# Patient Record
Sex: Female | Born: 1945 | Race: White | Hispanic: No | Marital: Married | Smoking: Former smoker
Health system: Southern US, Community
[De-identification: ages and names within clinical notes are randomized; demographics above are authoritative.]

## PROBLEM LIST (undated history)

## (undated) DIAGNOSIS — E039 Hypothyroidism, unspecified: Secondary | ICD-10-CM

## (undated) DIAGNOSIS — F32A Depression, unspecified: Secondary | ICD-10-CM

## (undated) DIAGNOSIS — F329 Major depressive disorder, single episode, unspecified: Secondary | ICD-10-CM

## (undated) DIAGNOSIS — I10 Essential (primary) hypertension: Secondary | ICD-10-CM

## (undated) DIAGNOSIS — K219 Gastro-esophageal reflux disease without esophagitis: Secondary | ICD-10-CM

## (undated) DIAGNOSIS — M1990 Unspecified osteoarthritis, unspecified site: Secondary | ICD-10-CM

## (undated) DIAGNOSIS — R0602 Shortness of breath: Secondary | ICD-10-CM

## (undated) DIAGNOSIS — Z87442 Personal history of urinary calculi: Secondary | ICD-10-CM

## (undated) DIAGNOSIS — E669 Obesity, unspecified: Secondary | ICD-10-CM

## (undated) HISTORY — PX: TUBAL LIGATION: SHX77

## (undated) HISTORY — DX: Gastro-esophageal reflux disease without esophagitis: K21.9

## (undated) HISTORY — DX: Unspecified osteoarthritis, unspecified site: M19.90

## (undated) HISTORY — DX: Essential (primary) hypertension: I10

## (undated) HISTORY — DX: Personal history of urinary calculi: Z87.442

## (undated) HISTORY — DX: Hypothyroidism, unspecified: E03.9

## (undated) HISTORY — PX: KNEE SURGERY: SHX244

## (undated) HISTORY — DX: Shortness of breath: R06.02

## (undated) HISTORY — PX: FOOT SURGERY: SHX648

## (undated) HISTORY — DX: Depression, unspecified: F32.A

## (undated) HISTORY — DX: Obesity, unspecified: E66.9

## (undated) HISTORY — PX: TONSILLECTOMY: SUR1361

## (undated) HISTORY — DX: Major depressive disorder, single episode, unspecified: F32.9

---

## 1998-05-25 ENCOUNTER — Other Ambulatory Visit: Admission: RE | Admit: 1998-05-25 | Discharge: 1998-05-25 | Payer: Self-pay | Admitting: *Deleted

## 1998-07-28 ENCOUNTER — Encounter: Payer: Self-pay | Admitting: *Deleted

## 1998-07-28 ENCOUNTER — Ambulatory Visit (HOSPITAL_COMMUNITY): Admission: RE | Admit: 1998-07-28 | Discharge: 1998-07-28 | Payer: Self-pay | Admitting: *Deleted

## 1999-10-21 ENCOUNTER — Other Ambulatory Visit: Admission: RE | Admit: 1999-10-21 | Discharge: 1999-10-21 | Payer: Self-pay | Admitting: Internal Medicine

## 2000-11-10 ENCOUNTER — Encounter: Payer: Self-pay | Admitting: Internal Medicine

## 2000-11-10 ENCOUNTER — Ambulatory Visit (HOSPITAL_COMMUNITY): Admission: RE | Admit: 2000-11-10 | Discharge: 2000-11-10 | Payer: Self-pay | Admitting: Internal Medicine

## 2002-05-23 ENCOUNTER — Encounter: Payer: Self-pay | Admitting: Internal Medicine

## 2002-05-23 ENCOUNTER — Ambulatory Visit (HOSPITAL_COMMUNITY): Admission: RE | Admit: 2002-05-23 | Discharge: 2002-05-23 | Payer: Self-pay | Admitting: Internal Medicine

## 2007-01-25 ENCOUNTER — Ambulatory Visit (HOSPITAL_COMMUNITY): Admission: RE | Admit: 2007-01-25 | Discharge: 2007-01-25 | Payer: Self-pay | Admitting: Internal Medicine

## 2008-01-19 ENCOUNTER — Ambulatory Visit (HOSPITAL_COMMUNITY): Admission: RE | Admit: 2008-01-19 | Discharge: 2008-01-19 | Payer: Self-pay | Admitting: Internal Medicine

## 2008-02-08 ENCOUNTER — Ambulatory Visit (HOSPITAL_COMMUNITY): Admission: RE | Admit: 2008-02-08 | Discharge: 2008-02-08 | Payer: Self-pay | Admitting: Internal Medicine

## 2009-02-14 ENCOUNTER — Ambulatory Visit (HOSPITAL_COMMUNITY): Admission: RE | Admit: 2009-02-14 | Discharge: 2009-02-14 | Payer: Self-pay | Admitting: Internal Medicine

## 2010-04-24 ENCOUNTER — Encounter
Admission: RE | Admit: 2010-04-24 | Discharge: 2010-04-24 | Payer: Self-pay | Source: Home / Self Care | Attending: Internal Medicine | Admitting: Internal Medicine

## 2010-04-28 ENCOUNTER — Encounter
Admission: RE | Admit: 2010-04-28 | Discharge: 2010-04-28 | Payer: Self-pay | Source: Home / Self Care | Attending: Internal Medicine | Admitting: Internal Medicine

## 2010-09-28 ENCOUNTER — Emergency Department (HOSPITAL_COMMUNITY)
Admission: EM | Admit: 2010-09-28 | Discharge: 2010-09-28 | Disposition: A | Discharge status: Home / Self Care | Payer: Medicare Other | Attending: Emergency Medicine | Admitting: Emergency Medicine

## 2010-09-28 ENCOUNTER — Emergency Department (HOSPITAL_COMMUNITY): Payer: Medicare Other

## 2010-09-28 DIAGNOSIS — R1032 Left lower quadrant pain: Secondary | ICD-10-CM | POA: Insufficient documentation

## 2010-09-28 DIAGNOSIS — I10 Essential (primary) hypertension: Secondary | ICD-10-CM | POA: Insufficient documentation

## 2010-09-28 DIAGNOSIS — E039 Hypothyroidism, unspecified: Secondary | ICD-10-CM | POA: Insufficient documentation

## 2010-09-28 DIAGNOSIS — N201 Calculus of ureter: Secondary | ICD-10-CM | POA: Insufficient documentation

## 2010-09-28 LAB — DIFFERENTIAL
Basophils Relative: 0 % (ref 0–1)
Eosinophils Absolute: 0 10*3/uL (ref 0.0–0.7)
Lymphocytes Relative: 14 % (ref 12–46)
Monocytes Relative: 9 % (ref 3–12)
Neutrophils Relative %: 77 % (ref 43–77)

## 2010-09-28 LAB — COMPREHENSIVE METABOLIC PANEL
BUN: 23 mg/dL (ref 6–23)
Calcium: 10 mg/dL (ref 8.4–10.5)
GFR calc Af Amer: 57 mL/min — ABNORMAL LOW (ref >60)
Glucose, Bld: 111 mg/dL — ABNORMAL HIGH (ref 70–99)
Total Protein: 7.8 g/dL (ref 6.0–8.3)

## 2010-09-28 LAB — URINALYSIS, ROUTINE W REFLEX MICROSCOPIC
Bilirubin Urine: NEGATIVE
Nitrite: NEGATIVE
Specific Gravity, Urine: 1.017 (ref 1.005–1.030)
pH: 7.5 (ref 5.0–8.0)

## 2010-09-28 LAB — URINE MICROSCOPIC-ADD ON

## 2010-09-28 LAB — CBC
Hemoglobin: 11.8 g/dL — ABNORMAL LOW (ref 12.0–15.0)
MCV: 76.8 fL — ABNORMAL LOW (ref 78.0–100.0)
Platelets: 204 10*3/uL (ref 150–400)
RBC: 4.57 MIL/uL (ref 3.87–5.11)
WBC: 8 10*3/uL (ref 4.0–10.5)

## 2010-09-28 MED ORDER — IOHEXOL 300 MG/ML  SOLN
100.0000 mL | Freq: Once | INTRAMUSCULAR | Status: AC | PRN
  Administered 2010-09-28: 100 mL via INTRAVENOUS

## 2010-09-29 LAB — URINE CULTURE
Colony Count: 10000
Culture  Setup Time: 201206231806

## 2010-10-07 ENCOUNTER — Ambulatory Visit (HOSPITAL_COMMUNITY)
Admission: RE | Admit: 2010-10-07 | Discharge: 2010-10-07 | Disposition: A | Discharge status: Home / Self Care | Payer: Medicare Other | Source: Ambulatory Visit | Attending: Urology | Admitting: Urology

## 2010-10-07 DIAGNOSIS — E039 Hypothyroidism, unspecified: Secondary | ICD-10-CM | POA: Insufficient documentation

## 2010-10-07 DIAGNOSIS — I10 Essential (primary) hypertension: Secondary | ICD-10-CM | POA: Insufficient documentation

## 2010-10-07 DIAGNOSIS — K219 Gastro-esophageal reflux disease without esophagitis: Secondary | ICD-10-CM | POA: Insufficient documentation

## 2010-10-07 DIAGNOSIS — M129 Arthropathy, unspecified: Secondary | ICD-10-CM | POA: Insufficient documentation

## 2010-10-07 DIAGNOSIS — N201 Calculus of ureter: Secondary | ICD-10-CM | POA: Insufficient documentation

## 2013-06-12 IMAGING — CT CT ABD-PELV W/ CM
2 of 5 series · 17 of 46 positions shown, 19 images · IV contrast (APPLIED)
Comparison: MRI from 04/28/2010.  CT from 01/19/2008.

CLINICAL DATA: Left lower quadrant pain with nausea vomiting.

CT ABDOMEN AND PELVIS WITH CONTRAST
TECHNIQUE: Multidetector CT imaging of the abdomen and pelvis was
performed following the standard protocol during bolus
administration of intravenous contrast.
Contrast: 100 ml Emnipaque-8PP

[Series 2: abd_pel 5.0 b40f st · axial · 0.81mm/px · z∈[+897,+1287]mm · 14 of 88 slices shown, 16 images]
[im 5/88  soft-tissue]
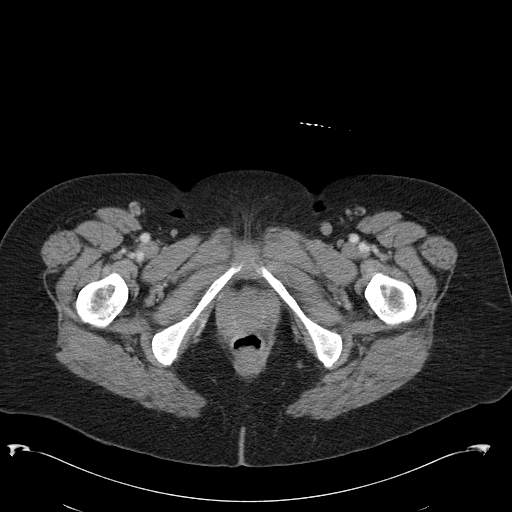
[im 5/88  bone]
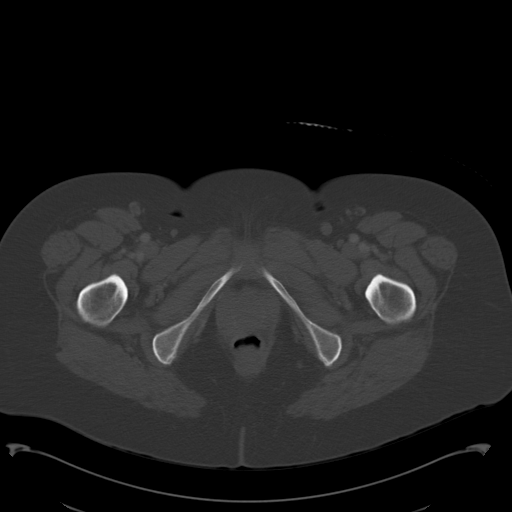
[im 10/88  soft-tissue]
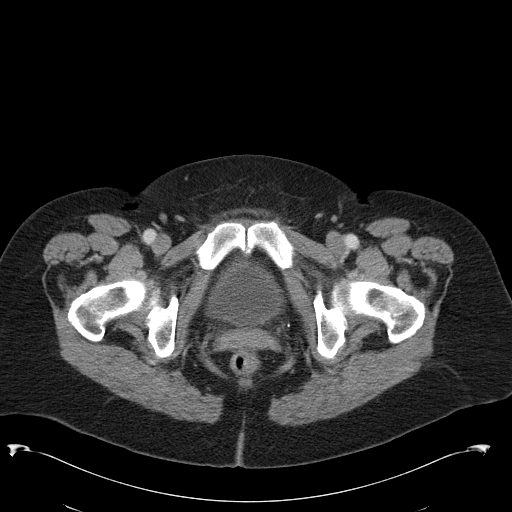
[im 20/88  soft-tissue]
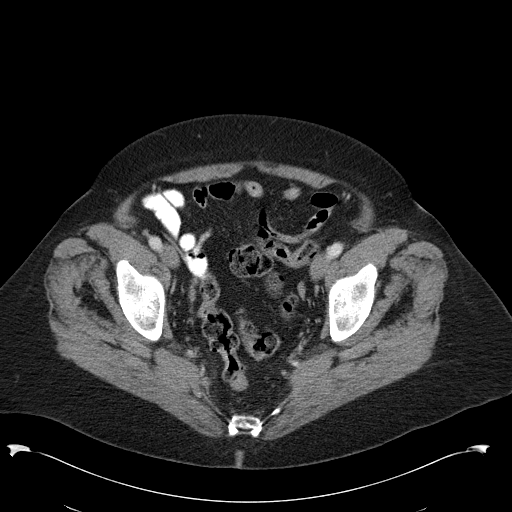
[im 25/88  soft-tissue]
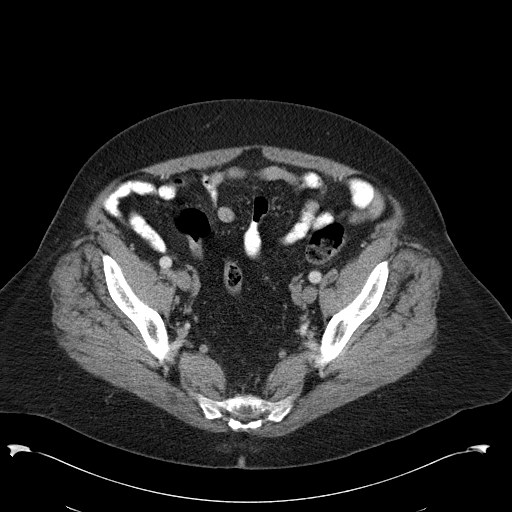
[im 30/88  soft-tissue]
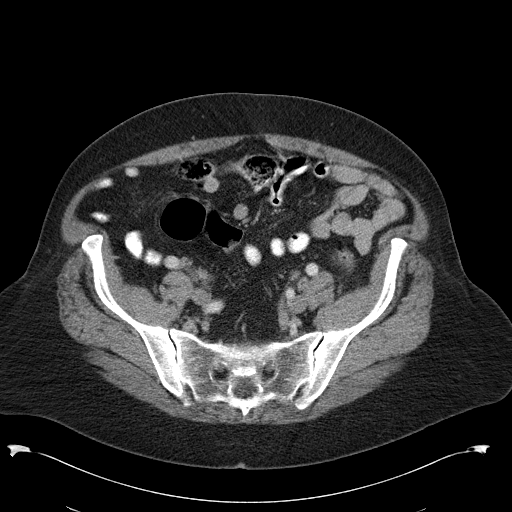
[im 34/88  soft-tissue]
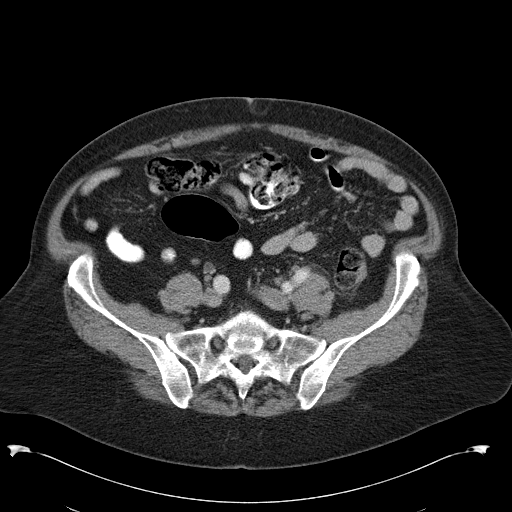
[im 39/88  soft-tissue]
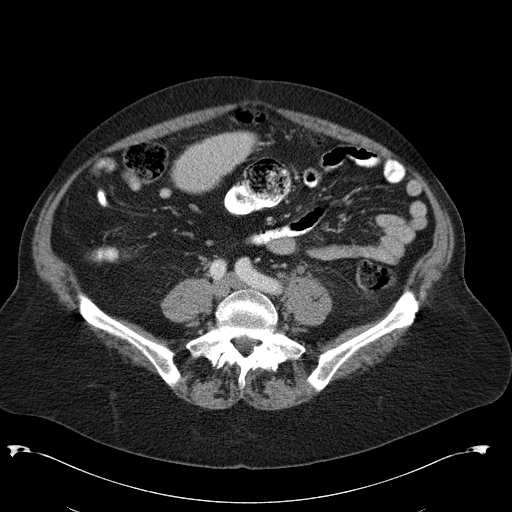
[im 49/88  soft-tissue]
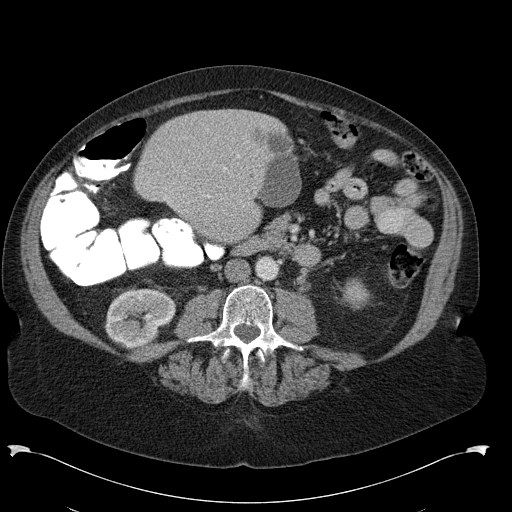
[im 54/88  soft-tissue]
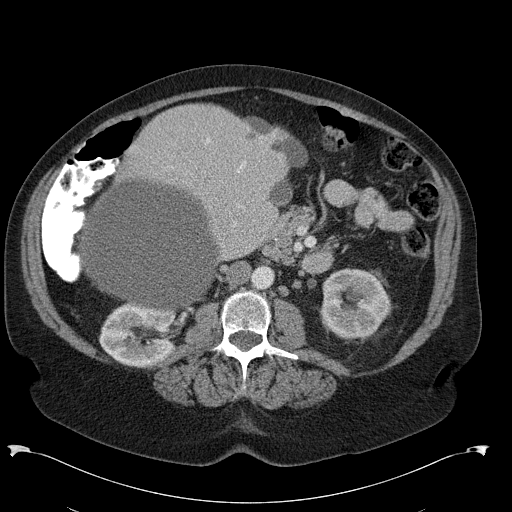
[im 54/88  bone]
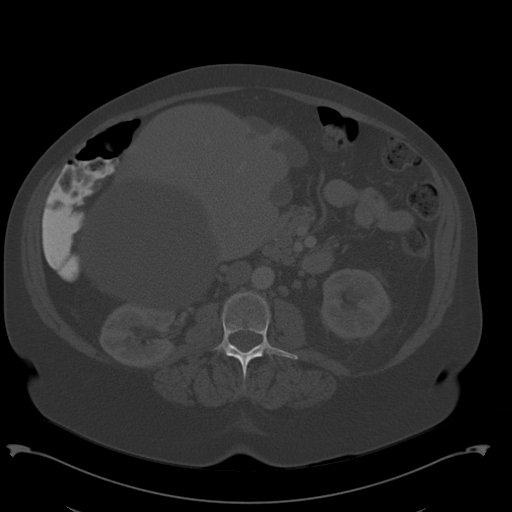
[im 59/88  soft-tissue]
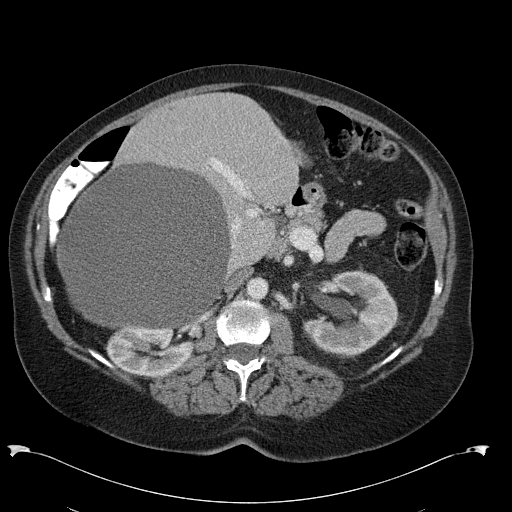
[im 63/88  soft-tissue]
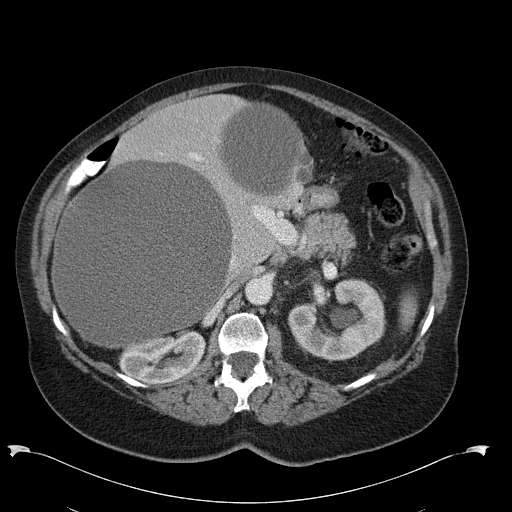
[im 68/88  soft-tissue]
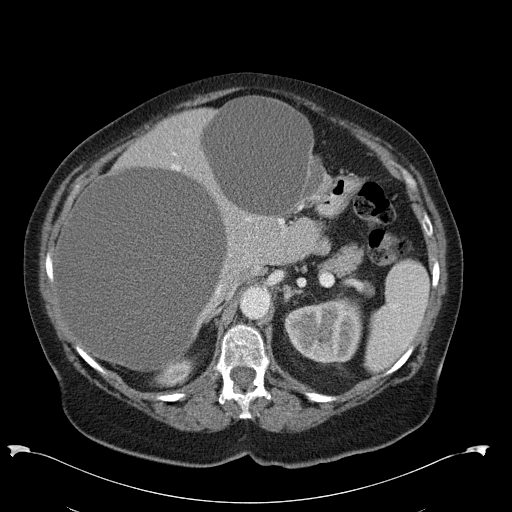
[im 78/88  soft-tissue]
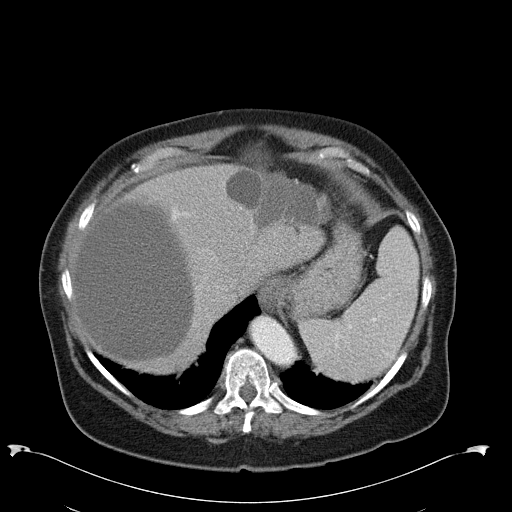
[im 83/88  soft-tissue]
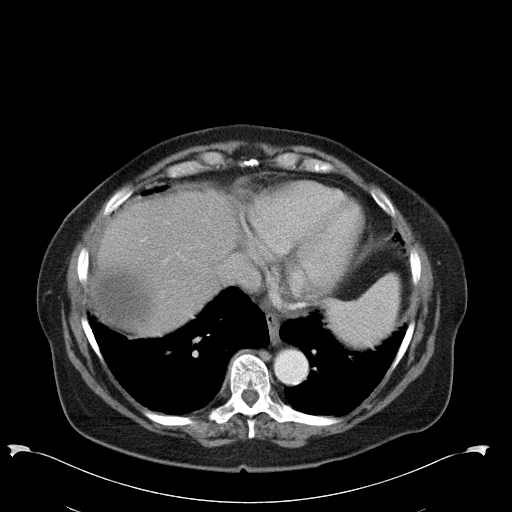

[Series 602: coronal abdomen · coronal · 0.89mm/px · 3 of 159 slices shown]
[im 53/159  soft-tissue]
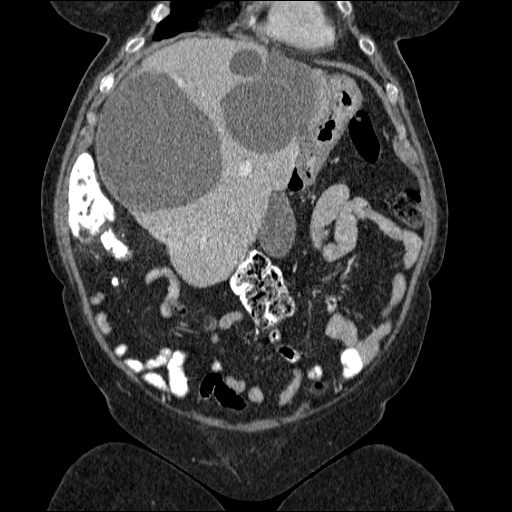
[im 71/159  soft-tissue]
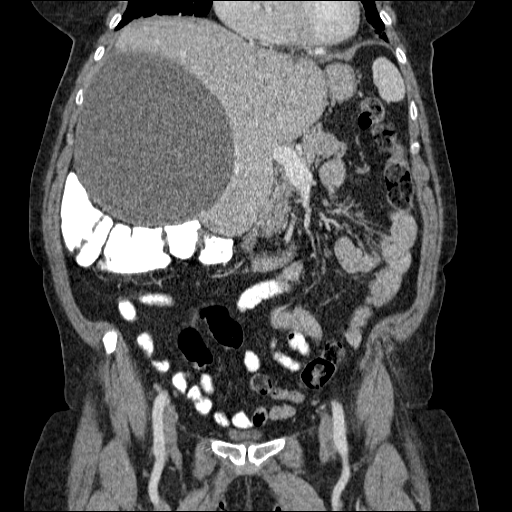
[im 88/159  soft-tissue]
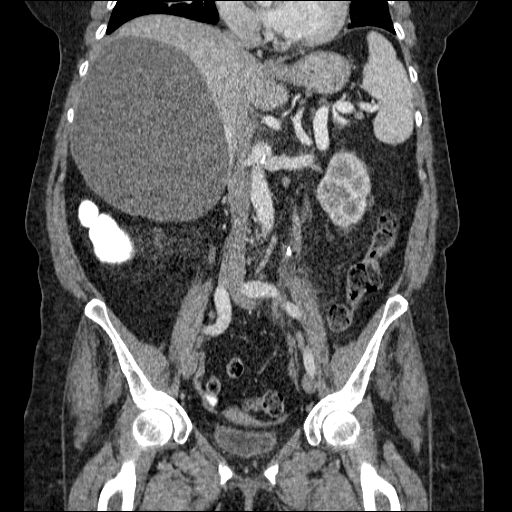

[17 of 46 positions shown; findings below may reference images not displayed]

FINDINGS: Large hepatic cysts are again noted.  The largest is in
the right liver measuring about 16 cm.  Other prominent cysts are
seen in the left liver, the largest of which measures about 10 cm.
This is stable since the prior MRI.  The spleen is normal.  The
stomach, duodenum, pancreas, and adrenal glands are normal.
Gallbladder seems to be in an atypical location under the left
liver.  The pelvis secondary to anatomic distortion from the large
cyst.

There is a subtle asymmetric renal perfusion with decreased
perfusion noted to the left kidney.  Tiny bilateral renal cortical
cysts are evident.  There is some left perinephric
edema/inflammation.  6 x 4 x 9 mm stone is identified in the
proximal left ureter.

No abdominal aortic aneurysm.  No free fluid in the abdomen.  No
abdominal lymphadenopathy.

Imaging through the pelvis shows no stones in the distal ureters or
bladder.  Uterus is unremarkable aside from some calcified
fibroids.  There is no adnexal mass.

No free fluid in the pelvis.  No pelvic sidewall lymphadenopathy.
No colonic diverticulitis.  The terminal ileum is normal. The
appendix is not visualized, but there is no edema or inflammation
in the region of the cecum.

Bone windows reveal no worrisome lytic or sclerotic osseous
lesions.
IMPRESSION: 6 x 4 x 9 mm stone in the proximal left ureter generates mild
secondary changes in the left kidney.

Several large hepatic cysts, as seen previously.

## 2016-04-14 DIAGNOSIS — Z779 Other contact with and (suspected) exposures hazardous to health: Secondary | ICD-10-CM | POA: Diagnosis not present

## 2016-04-14 DIAGNOSIS — M8580 Other specified disorders of bone density and structure, unspecified site: Secondary | ICD-10-CM | POA: Diagnosis not present

## 2016-04-14 DIAGNOSIS — Z01419 Encounter for gynecological examination (general) (routine) without abnormal findings: Secondary | ICD-10-CM | POA: Diagnosis not present

## 2016-04-14 DIAGNOSIS — N3941 Urge incontinence: Secondary | ICD-10-CM | POA: Diagnosis not present

## 2016-05-22 DIAGNOSIS — R509 Fever, unspecified: Secondary | ICD-10-CM | POA: Diagnosis not present

## 2016-08-19 DIAGNOSIS — N390 Urinary tract infection, site not specified: Secondary | ICD-10-CM | POA: Diagnosis not present

## 2016-08-19 DIAGNOSIS — R8299 Other abnormal findings in urine: Secondary | ICD-10-CM | POA: Diagnosis not present

## 2016-08-19 DIAGNOSIS — M810 Age-related osteoporosis without current pathological fracture: Secondary | ICD-10-CM | POA: Diagnosis not present

## 2016-08-19 DIAGNOSIS — E038 Other specified hypothyroidism: Secondary | ICD-10-CM | POA: Diagnosis not present

## 2016-08-19 DIAGNOSIS — I10 Essential (primary) hypertension: Secondary | ICD-10-CM | POA: Diagnosis not present

## 2016-08-26 DIAGNOSIS — Z1212 Encounter for screening for malignant neoplasm of rectum: Secondary | ICD-10-CM | POA: Diagnosis not present

## 2016-08-26 DIAGNOSIS — E668 Other obesity: Secondary | ICD-10-CM | POA: Diagnosis not present

## 2016-08-26 DIAGNOSIS — M810 Age-related osteoporosis without current pathological fracture: Secondary | ICD-10-CM | POA: Diagnosis not present

## 2016-08-26 DIAGNOSIS — D508 Other iron deficiency anemias: Secondary | ICD-10-CM | POA: Diagnosis not present

## 2016-08-26 DIAGNOSIS — D692 Other nonthrombocytopenic purpura: Secondary | ICD-10-CM | POA: Diagnosis not present

## 2016-08-26 DIAGNOSIS — I5189 Other ill-defined heart diseases: Secondary | ICD-10-CM | POA: Diagnosis not present

## 2016-08-26 DIAGNOSIS — I10 Essential (primary) hypertension: Secondary | ICD-10-CM | POA: Diagnosis not present

## 2016-08-26 DIAGNOSIS — M1990 Unspecified osteoarthritis, unspecified site: Secondary | ICD-10-CM | POA: Diagnosis not present

## 2016-08-26 DIAGNOSIS — R69 Illness, unspecified: Secondary | ICD-10-CM | POA: Diagnosis not present

## 2016-08-26 DIAGNOSIS — Z0000 Encounter for general adult medical examination without abnormal findings: Secondary | ICD-10-CM | POA: Diagnosis not present

## 2016-08-26 DIAGNOSIS — K219 Gastro-esophageal reflux disease without esophagitis: Secondary | ICD-10-CM | POA: Diagnosis not present

## 2016-09-02 DIAGNOSIS — H40013 Open angle with borderline findings, low risk, bilateral: Secondary | ICD-10-CM | POA: Diagnosis not present

## 2016-09-02 DIAGNOSIS — H353131 Nonexudative age-related macular degeneration, bilateral, early dry stage: Secondary | ICD-10-CM | POA: Diagnosis not present

## 2016-09-02 DIAGNOSIS — H35033 Hypertensive retinopathy, bilateral: Secondary | ICD-10-CM | POA: Diagnosis not present

## 2016-09-02 DIAGNOSIS — H35372 Puckering of macula, left eye: Secondary | ICD-10-CM | POA: Diagnosis not present

## 2016-10-29 ENCOUNTER — Ambulatory Visit (INDEPENDENT_AMBULATORY_CARE_PROVIDER_SITE_OTHER): Payer: Medicare HMO

## 2016-10-29 ENCOUNTER — Ambulatory Visit (INDEPENDENT_AMBULATORY_CARE_PROVIDER_SITE_OTHER): Payer: Medicare HMO | Admitting: Podiatry

## 2016-10-29 VITALS — BP 127/82 | HR 75

## 2016-10-29 DIAGNOSIS — M79675 Pain in left toe(s): Secondary | ICD-10-CM

## 2016-10-29 DIAGNOSIS — S93601A Unspecified sprain of right foot, initial encounter: Secondary | ICD-10-CM

## 2016-10-29 DIAGNOSIS — M779 Enthesopathy, unspecified: Secondary | ICD-10-CM

## 2016-10-29 DIAGNOSIS — Z472 Encounter for removal of internal fixation device: Secondary | ICD-10-CM

## 2016-10-29 MED ORDER — TRIAMCINOLONE ACETONIDE 10 MG/ML IJ SUSP
10.0000 mg | Freq: Once | INTRAMUSCULAR | Status: AC
  Administered 2016-10-29: 10 mg

## 2016-10-29 NOTE — Progress Notes (Signed)
Subjective:    Patient ID: Elizabeth PlowmanJudith Schimming, female   DOB: 71 y.o.   MRN: [id number]   HPI patient states that she's developed discomfort in the top of the right foot and the left foot has a discomfort in the arch that she's not sure about    Review of Systems  Neurological: Sensory change: neurovascular status was found to be intact with patient noted to have inflammation dorsum second metatarsal right of about 6 weeks duration localized in natureto the distal joint with also mildly indication of discomfort left arch but no palpable mass not. Seizures: inflammatory capsulitis tendinitis right dorsum foot along with possibility for a nodule or other cystic formation plantar left.  All other systems reviewed and are negative.       Objective:  Physical Exam  Cardiovascular: Intact distal pulses.   Pulmonary/Chest: Breath sounds normal.  Musculoskeletal: Normal range of motion.  Neurological: She is alert.  Skin: Skin is warm and dry.  Nursing note and vitals reviewed.  above discussed     Assessment:    Above discussed     Plan:   H&P and x-rays reviewed with patient. Today I went ahead and injected carefully dorsum of the right foot 3 mg Dexon some Kenalog 5 mg Xylocaine and advised on anti-inflammatories reduced activities

## 2016-11-03 DIAGNOSIS — R69 Illness, unspecified: Secondary | ICD-10-CM | POA: Diagnosis not present

## 2016-11-05 ENCOUNTER — Ambulatory Visit: Payer: Self-pay | Admitting: Podiatry

## 2016-11-17 DIAGNOSIS — M9902 Segmental and somatic dysfunction of thoracic region: Secondary | ICD-10-CM | POA: Diagnosis not present

## 2016-11-17 DIAGNOSIS — M9901 Segmental and somatic dysfunction of cervical region: Secondary | ICD-10-CM | POA: Diagnosis not present

## 2016-11-17 DIAGNOSIS — M5417 Radiculopathy, lumbosacral region: Secondary | ICD-10-CM | POA: Diagnosis not present

## 2016-11-17 DIAGNOSIS — M5032 Other cervical disc degeneration, mid-cervical region, unspecified level: Secondary | ICD-10-CM | POA: Diagnosis not present

## 2016-11-17 DIAGNOSIS — M9903 Segmental and somatic dysfunction of lumbar region: Secondary | ICD-10-CM | POA: Diagnosis not present

## 2016-11-17 DIAGNOSIS — M5414 Radiculopathy, thoracic region: Secondary | ICD-10-CM | POA: Diagnosis not present

## 2016-11-17 DIAGNOSIS — R2689 Other abnormalities of gait and mobility: Secondary | ICD-10-CM | POA: Diagnosis not present

## 2016-11-19 DIAGNOSIS — M9902 Segmental and somatic dysfunction of thoracic region: Secondary | ICD-10-CM | POA: Diagnosis not present

## 2016-11-19 DIAGNOSIS — M5414 Radiculopathy, thoracic region: Secondary | ICD-10-CM | POA: Diagnosis not present

## 2016-11-19 DIAGNOSIS — M5032 Other cervical disc degeneration, mid-cervical region, unspecified level: Secondary | ICD-10-CM | POA: Diagnosis not present

## 2016-11-19 DIAGNOSIS — M9901 Segmental and somatic dysfunction of cervical region: Secondary | ICD-10-CM | POA: Diagnosis not present

## 2016-11-19 DIAGNOSIS — M9903 Segmental and somatic dysfunction of lumbar region: Secondary | ICD-10-CM | POA: Diagnosis not present

## 2016-11-19 DIAGNOSIS — M5417 Radiculopathy, lumbosacral region: Secondary | ICD-10-CM | POA: Diagnosis not present

## 2016-11-21 DIAGNOSIS — M9901 Segmental and somatic dysfunction of cervical region: Secondary | ICD-10-CM | POA: Diagnosis not present

## 2016-11-21 DIAGNOSIS — M9902 Segmental and somatic dysfunction of thoracic region: Secondary | ICD-10-CM | POA: Diagnosis not present

## 2016-11-21 DIAGNOSIS — M5032 Other cervical disc degeneration, mid-cervical region, unspecified level: Secondary | ICD-10-CM | POA: Diagnosis not present

## 2016-11-21 DIAGNOSIS — M9903 Segmental and somatic dysfunction of lumbar region: Secondary | ICD-10-CM | POA: Diagnosis not present

## 2016-11-21 DIAGNOSIS — M5414 Radiculopathy, thoracic region: Secondary | ICD-10-CM | POA: Diagnosis not present

## 2016-11-21 DIAGNOSIS — M5417 Radiculopathy, lumbosacral region: Secondary | ICD-10-CM | POA: Diagnosis not present

## 2016-11-24 DIAGNOSIS — M5417 Radiculopathy, lumbosacral region: Secondary | ICD-10-CM | POA: Diagnosis not present

## 2016-11-24 DIAGNOSIS — M9902 Segmental and somatic dysfunction of thoracic region: Secondary | ICD-10-CM | POA: Diagnosis not present

## 2016-11-24 DIAGNOSIS — M9901 Segmental and somatic dysfunction of cervical region: Secondary | ICD-10-CM | POA: Diagnosis not present

## 2016-11-24 DIAGNOSIS — M5414 Radiculopathy, thoracic region: Secondary | ICD-10-CM | POA: Diagnosis not present

## 2016-11-24 DIAGNOSIS — M9903 Segmental and somatic dysfunction of lumbar region: Secondary | ICD-10-CM | POA: Diagnosis not present

## 2016-11-24 DIAGNOSIS — M5032 Other cervical disc degeneration, mid-cervical region, unspecified level: Secondary | ICD-10-CM | POA: Diagnosis not present

## 2016-11-26 DIAGNOSIS — M5414 Radiculopathy, thoracic region: Secondary | ICD-10-CM | POA: Diagnosis not present

## 2016-11-26 DIAGNOSIS — M9902 Segmental and somatic dysfunction of thoracic region: Secondary | ICD-10-CM | POA: Diagnosis not present

## 2016-11-26 DIAGNOSIS — M5032 Other cervical disc degeneration, mid-cervical region, unspecified level: Secondary | ICD-10-CM | POA: Diagnosis not present

## 2016-11-26 DIAGNOSIS — M9901 Segmental and somatic dysfunction of cervical region: Secondary | ICD-10-CM | POA: Diagnosis not present

## 2016-11-26 DIAGNOSIS — M9903 Segmental and somatic dysfunction of lumbar region: Secondary | ICD-10-CM | POA: Diagnosis not present

## 2016-11-26 DIAGNOSIS — M5417 Radiculopathy, lumbosacral region: Secondary | ICD-10-CM | POA: Diagnosis not present

## 2016-11-28 DIAGNOSIS — M5414 Radiculopathy, thoracic region: Secondary | ICD-10-CM | POA: Diagnosis not present

## 2016-11-28 DIAGNOSIS — M9903 Segmental and somatic dysfunction of lumbar region: Secondary | ICD-10-CM | POA: Diagnosis not present

## 2016-11-28 DIAGNOSIS — M5417 Radiculopathy, lumbosacral region: Secondary | ICD-10-CM | POA: Diagnosis not present

## 2016-11-28 DIAGNOSIS — M9902 Segmental and somatic dysfunction of thoracic region: Secondary | ICD-10-CM | POA: Diagnosis not present

## 2016-11-28 DIAGNOSIS — M5032 Other cervical disc degeneration, mid-cervical region, unspecified level: Secondary | ICD-10-CM | POA: Diagnosis not present

## 2016-11-28 DIAGNOSIS — M9901 Segmental and somatic dysfunction of cervical region: Secondary | ICD-10-CM | POA: Diagnosis not present

## 2016-12-01 DIAGNOSIS — M9903 Segmental and somatic dysfunction of lumbar region: Secondary | ICD-10-CM | POA: Diagnosis not present

## 2016-12-01 DIAGNOSIS — M9902 Segmental and somatic dysfunction of thoracic region: Secondary | ICD-10-CM | POA: Diagnosis not present

## 2016-12-01 DIAGNOSIS — M5032 Other cervical disc degeneration, mid-cervical region, unspecified level: Secondary | ICD-10-CM | POA: Diagnosis not present

## 2016-12-01 DIAGNOSIS — M5414 Radiculopathy, thoracic region: Secondary | ICD-10-CM | POA: Diagnosis not present

## 2016-12-01 DIAGNOSIS — M9901 Segmental and somatic dysfunction of cervical region: Secondary | ICD-10-CM | POA: Diagnosis not present

## 2016-12-01 DIAGNOSIS — M5417 Radiculopathy, lumbosacral region: Secondary | ICD-10-CM | POA: Diagnosis not present

## 2016-12-03 DIAGNOSIS — M5417 Radiculopathy, lumbosacral region: Secondary | ICD-10-CM | POA: Diagnosis not present

## 2016-12-03 DIAGNOSIS — M9902 Segmental and somatic dysfunction of thoracic region: Secondary | ICD-10-CM | POA: Diagnosis not present

## 2016-12-03 DIAGNOSIS — M5032 Other cervical disc degeneration, mid-cervical region, unspecified level: Secondary | ICD-10-CM | POA: Diagnosis not present

## 2016-12-03 DIAGNOSIS — M9901 Segmental and somatic dysfunction of cervical region: Secondary | ICD-10-CM | POA: Diagnosis not present

## 2016-12-03 DIAGNOSIS — M5414 Radiculopathy, thoracic region: Secondary | ICD-10-CM | POA: Diagnosis not present

## 2016-12-03 DIAGNOSIS — M9903 Segmental and somatic dysfunction of lumbar region: Secondary | ICD-10-CM | POA: Diagnosis not present

## 2016-12-05 DIAGNOSIS — M5414 Radiculopathy, thoracic region: Secondary | ICD-10-CM | POA: Diagnosis not present

## 2016-12-05 DIAGNOSIS — M5032 Other cervical disc degeneration, mid-cervical region, unspecified level: Secondary | ICD-10-CM | POA: Diagnosis not present

## 2016-12-05 DIAGNOSIS — M9903 Segmental and somatic dysfunction of lumbar region: Secondary | ICD-10-CM | POA: Diagnosis not present

## 2016-12-05 DIAGNOSIS — M5417 Radiculopathy, lumbosacral region: Secondary | ICD-10-CM | POA: Diagnosis not present

## 2016-12-05 DIAGNOSIS — M9902 Segmental and somatic dysfunction of thoracic region: Secondary | ICD-10-CM | POA: Diagnosis not present

## 2016-12-05 DIAGNOSIS — M9901 Segmental and somatic dysfunction of cervical region: Secondary | ICD-10-CM | POA: Diagnosis not present

## 2016-12-09 DIAGNOSIS — M9901 Segmental and somatic dysfunction of cervical region: Secondary | ICD-10-CM | POA: Diagnosis not present

## 2016-12-09 DIAGNOSIS — M5414 Radiculopathy, thoracic region: Secondary | ICD-10-CM | POA: Diagnosis not present

## 2016-12-09 DIAGNOSIS — M5032 Other cervical disc degeneration, mid-cervical region, unspecified level: Secondary | ICD-10-CM | POA: Diagnosis not present

## 2016-12-09 DIAGNOSIS — M9902 Segmental and somatic dysfunction of thoracic region: Secondary | ICD-10-CM | POA: Diagnosis not present

## 2016-12-09 DIAGNOSIS — M9903 Segmental and somatic dysfunction of lumbar region: Secondary | ICD-10-CM | POA: Diagnosis not present

## 2016-12-09 DIAGNOSIS — M5417 Radiculopathy, lumbosacral region: Secondary | ICD-10-CM | POA: Diagnosis not present

## 2016-12-10 DIAGNOSIS — M5417 Radiculopathy, lumbosacral region: Secondary | ICD-10-CM | POA: Diagnosis not present

## 2016-12-10 DIAGNOSIS — M5032 Other cervical disc degeneration, mid-cervical region, unspecified level: Secondary | ICD-10-CM | POA: Diagnosis not present

## 2016-12-10 DIAGNOSIS — M5414 Radiculopathy, thoracic region: Secondary | ICD-10-CM | POA: Diagnosis not present

## 2016-12-10 DIAGNOSIS — M9903 Segmental and somatic dysfunction of lumbar region: Secondary | ICD-10-CM | POA: Diagnosis not present

## 2016-12-10 DIAGNOSIS — M9902 Segmental and somatic dysfunction of thoracic region: Secondary | ICD-10-CM | POA: Diagnosis not present

## 2016-12-10 DIAGNOSIS — M9901 Segmental and somatic dysfunction of cervical region: Secondary | ICD-10-CM | POA: Diagnosis not present

## 2016-12-16 DIAGNOSIS — M5414 Radiculopathy, thoracic region: Secondary | ICD-10-CM | POA: Diagnosis not present

## 2016-12-16 DIAGNOSIS — M5417 Radiculopathy, lumbosacral region: Secondary | ICD-10-CM | POA: Diagnosis not present

## 2016-12-16 DIAGNOSIS — M9901 Segmental and somatic dysfunction of cervical region: Secondary | ICD-10-CM | POA: Diagnosis not present

## 2016-12-16 DIAGNOSIS — M5032 Other cervical disc degeneration, mid-cervical region, unspecified level: Secondary | ICD-10-CM | POA: Diagnosis not present

## 2016-12-16 DIAGNOSIS — M9902 Segmental and somatic dysfunction of thoracic region: Secondary | ICD-10-CM | POA: Diagnosis not present

## 2016-12-16 DIAGNOSIS — M9903 Segmental and somatic dysfunction of lumbar region: Secondary | ICD-10-CM | POA: Diagnosis not present

## 2016-12-23 DIAGNOSIS — M9901 Segmental and somatic dysfunction of cervical region: Secondary | ICD-10-CM | POA: Diagnosis not present

## 2016-12-23 DIAGNOSIS — M5417 Radiculopathy, lumbosacral region: Secondary | ICD-10-CM | POA: Diagnosis not present

## 2016-12-23 DIAGNOSIS — M9902 Segmental and somatic dysfunction of thoracic region: Secondary | ICD-10-CM | POA: Diagnosis not present

## 2016-12-23 DIAGNOSIS — M5032 Other cervical disc degeneration, mid-cervical region, unspecified level: Secondary | ICD-10-CM | POA: Diagnosis not present

## 2016-12-23 DIAGNOSIS — M9903 Segmental and somatic dysfunction of lumbar region: Secondary | ICD-10-CM | POA: Diagnosis not present

## 2016-12-23 DIAGNOSIS — M5414 Radiculopathy, thoracic region: Secondary | ICD-10-CM | POA: Diagnosis not present

## 2016-12-29 DIAGNOSIS — R69 Illness, unspecified: Secondary | ICD-10-CM | POA: Diagnosis not present

## 2017-01-27 DIAGNOSIS — Z1231 Encounter for screening mammogram for malignant neoplasm of breast: Secondary | ICD-10-CM | POA: Diagnosis not present

## 2017-03-04 DIAGNOSIS — R69 Illness, unspecified: Secondary | ICD-10-CM | POA: Diagnosis not present

## 2017-03-04 DIAGNOSIS — I519 Heart disease, unspecified: Secondary | ICD-10-CM | POA: Diagnosis not present

## 2017-03-04 DIAGNOSIS — M1990 Unspecified osteoarthritis, unspecified site: Secondary | ICD-10-CM | POA: Diagnosis not present

## 2017-03-04 DIAGNOSIS — E668 Other obesity: Secondary | ICD-10-CM | POA: Diagnosis not present

## 2017-03-04 DIAGNOSIS — K219 Gastro-esophageal reflux disease without esophagitis: Secondary | ICD-10-CM | POA: Diagnosis not present

## 2017-03-04 DIAGNOSIS — R0609 Other forms of dyspnea: Secondary | ICD-10-CM | POA: Diagnosis not present

## 2017-03-04 DIAGNOSIS — Z6838 Body mass index (BMI) 38.0-38.9, adult: Secondary | ICD-10-CM | POA: Diagnosis not present

## 2017-03-04 DIAGNOSIS — I10 Essential (primary) hypertension: Secondary | ICD-10-CM | POA: Diagnosis not present

## 2017-05-04 DIAGNOSIS — Z779 Other contact with and (suspected) exposures hazardous to health: Secondary | ICD-10-CM | POA: Diagnosis not present

## 2017-06-03 DIAGNOSIS — H04123 Dry eye syndrome of bilateral lacrimal glands: Secondary | ICD-10-CM | POA: Diagnosis not present

## 2017-06-03 DIAGNOSIS — H353131 Nonexudative age-related macular degeneration, bilateral, early dry stage: Secondary | ICD-10-CM | POA: Diagnosis not present

## 2017-06-03 DIAGNOSIS — H35033 Hypertensive retinopathy, bilateral: Secondary | ICD-10-CM | POA: Diagnosis not present

## 2017-06-03 DIAGNOSIS — H40013 Open angle with borderline findings, low risk, bilateral: Secondary | ICD-10-CM | POA: Diagnosis not present

## 2017-09-30 ENCOUNTER — Encounter: Payer: Self-pay | Admitting: Cardiovascular Disease

## 2017-09-30 DIAGNOSIS — R82998 Other abnormal findings in urine: Secondary | ICD-10-CM | POA: Diagnosis not present

## 2017-09-30 DIAGNOSIS — E038 Other specified hypothyroidism: Secondary | ICD-10-CM | POA: Diagnosis not present

## 2017-09-30 DIAGNOSIS — E559 Vitamin D deficiency, unspecified: Secondary | ICD-10-CM | POA: Diagnosis not present

## 2017-09-30 DIAGNOSIS — I10 Essential (primary) hypertension: Secondary | ICD-10-CM | POA: Diagnosis not present

## 2017-10-07 DIAGNOSIS — E038 Other specified hypothyroidism: Secondary | ICD-10-CM | POA: Diagnosis not present

## 2017-10-07 DIAGNOSIS — D508 Other iron deficiency anemias: Secondary | ICD-10-CM | POA: Diagnosis not present

## 2017-10-07 DIAGNOSIS — E668 Other obesity: Secondary | ICD-10-CM | POA: Diagnosis not present

## 2017-10-07 DIAGNOSIS — I519 Heart disease, unspecified: Secondary | ICD-10-CM | POA: Diagnosis not present

## 2017-10-07 DIAGNOSIS — Z0000 Encounter for general adult medical examination without abnormal findings: Secondary | ICD-10-CM | POA: Diagnosis not present

## 2017-10-07 DIAGNOSIS — R69 Illness, unspecified: Secondary | ICD-10-CM | POA: Diagnosis not present

## 2017-10-07 DIAGNOSIS — R0600 Dyspnea, unspecified: Secondary | ICD-10-CM | POA: Diagnosis not present

## 2017-10-07 DIAGNOSIS — I10 Essential (primary) hypertension: Secondary | ICD-10-CM | POA: Diagnosis not present

## 2017-10-07 DIAGNOSIS — M25561 Pain in right knee: Secondary | ICD-10-CM | POA: Diagnosis not present

## 2017-10-07 DIAGNOSIS — M810 Age-related osteoporosis without current pathological fracture: Secondary | ICD-10-CM | POA: Diagnosis not present

## 2017-10-12 DIAGNOSIS — Z1212 Encounter for screening for malignant neoplasm of rectum: Secondary | ICD-10-CM | POA: Diagnosis not present

## 2017-12-02 ENCOUNTER — Encounter: Payer: Self-pay | Admitting: *Deleted

## 2017-12-03 ENCOUNTER — Ambulatory Visit: Payer: Medicare HMO | Admitting: Cardiovascular Disease

## 2017-12-03 ENCOUNTER — Encounter: Payer: Self-pay | Admitting: Cardiovascular Disease

## 2017-12-03 VITALS — BP 118/76 | HR 80 | Ht 63.0 in | Wt 215.0 lb

## 2017-12-03 DIAGNOSIS — I10 Essential (primary) hypertension: Secondary | ICD-10-CM | POA: Diagnosis not present

## 2017-12-03 DIAGNOSIS — E039 Hypothyroidism, unspecified: Secondary | ICD-10-CM | POA: Insufficient documentation

## 2017-12-03 DIAGNOSIS — R0600 Dyspnea, unspecified: Secondary | ICD-10-CM | POA: Diagnosis not present

## 2017-12-03 DIAGNOSIS — E669 Obesity, unspecified: Secondary | ICD-10-CM | POA: Diagnosis not present

## 2017-12-03 DIAGNOSIS — R0602 Shortness of breath: Secondary | ICD-10-CM

## 2017-12-03 HISTORY — DX: Shortness of breath: R06.02

## 2017-12-03 HISTORY — DX: Hypothyroidism, unspecified: E03.9

## 2017-12-03 HISTORY — DX: Essential (primary) hypertension: I10

## 2017-12-03 HISTORY — DX: Obesity, unspecified: E66.9

## 2017-12-03 NOTE — Patient Instructions (Signed)
Medication Instructions:  Your physician recommends that you continue on your current medications as directed. Please refer to the Current Medication list given to you today.  Labwork: none  Testing/Procedures: Your physician has requested that you have an echocardiogram. Echocardiography is a painless test that uses sound waves to create images of your heart. It provides your doctor with information about the size and shape of your heart and how well your heart's chambers and valves are working. This procedure takes approximately one hour. There are no restrictions for this procedure. [facility] AT [address]  Your physician has requested that you have an exercise tolerance test. For further information please visit [email or web address] Please also follow instruction sheet, as given.  Follow-Up: Your physician recommends that you schedule a follow-up appointment in: 2 MONTHS   Any Other Special Instructions Will Be Listed Below (If Applicable).   Exercise Stress Electrocardiogram An exercise stress electrocardiogram is a test to check how blood flows to your heart. It is done to find areas of poor blood flow. You will need to walk on a treadmill for this test. The electrocardiogram will record your heartbeat when you are at rest and when you are exercising. What happens before the procedure?  Do not have drinks with caffeine or foods with caffeine for 24 hours before the test, or as told by your doctor. This includes coffee, tea (even decaf tea), sodas, chocolate, and cocoa.  Follow your doctor's instructions about eating and drinking before the test.  Ask your doctor what medicines you should or should not take before the test. Take your medicines with water unless told by your doctor not to.  If you use an inhaler, bring it with you to the test.  Bring a snack to eat after the test.  Do not  smoke for 4 hours before the test.  Do not put lotions, powders,  creams, or oils on your chest before the test.  Wear comfortable shoes and clothing. What happens during the procedure?  You will have patches put on your chest. Small areas of your chest may need to be shaved. Wires will be connected to the patches.  Your heart rate will be watched while you are resting and while you are exercising.  You will walk on the treadmill. The treadmill will slowly get faster to raise your heart rate.  The test will take about 1-2 hours. What happens after the procedure?  Your heart rate and blood pressure will be watched after the test.  You may return to your normal diet, activities, and medicines or as told by your doctor. This information is not intended to replace advice given to you by your health care provider. Make sure you discuss any questions you have with your health care provider. Document Released: 09/10/2007 Document Revised: 11/21/2015 Document Reviewed: 11/29/2012 Elsevier Interactive Patient Education  2018 [employer].    Echocardiogram An echocardiogram, or echocardiography, uses sound waves (ultrasound) to produce an image of your heart. The echocardiogram is simple, painless, obtained within a short period of time, and offers valuable information to your health care provider. The images from an echocardiogram can provide information such as:  Evidence of coronary artery disease (CAD).  Heart size.  Heart muscle function.  Heart valve function.  Aneurysm detection.  Evidence of a past heart attack.  Fluid buildup around the heart.  Heart muscle thickening.  Assess heart valve function.  Tell a health care provider about:  Any allergies you  have.  All medicines you are taking, including vitamins, herbs, eye drops, creams, and over-the-counter medicines.  Any problems you or family members have had with anesthetic medicines.  Any blood disorders you have.  Any surgeries you have had.  Any medical conditions you  have.  Whether you are pregnant or may be pregnant. What happens before the procedure? No special preparation is needed. Eat and drink normally. What happens during the procedure?  In order to produce an image of your heart, gel will be applied to your chest and a wand-like tool (transducer) will be moved over your chest. The gel will help transmit the sound waves from the transducer. The sound waves will harmlessly bounce off your heart to allow the heart images to be captured in real-time motion. These images will then be recorded.  You may need an IV to receive a medicine that improves the quality of the pictures. What happens after the procedure? You may return to your normal schedule including diet, activities, and medicines, unless your health care provider tells you otherwise. This information is not intended to replace advice given to you by your health care provider. Make sure you discuss any questions you have with your health care provider. Document Released: 03/21/2000 Document Revised: 11/10/2015 Document Reviewed: 11/29/2012 Elsevier Interactive Patient Education  2017 [employer]

## 2017-12-03 NOTE — Progress Notes (Signed)
Cardiology Office Note   Date:  12/03/2017   ID:  Elizabeth Gamble, DOB [birth year], MRN [id number]  PCP:  Chilton Greathouse, MD  Cardiologist:   Chilton Si, MD   No chief complaint on file.    History of Present Illness: Elizabeth Gamble is a 72 y.o. female with hypertension, hyperlipidemia, obesity who is being seen today for the evaluation of shortness of breath at the request of Elizabeth, Ravisankar, MD.  Elizabeth Gamble saw Dr. Felipa Eth on 10/2017 and noted increasing exertional dyspnea.  She started to notice it several months ago as she started moving her mother into assisted living.  Prior to that she wasn't getting much exercise.  She now feels short of breath when she wakes up at night and has to walk from her bed to her bathroom.  She also notices it when going up steps.  She never has exertional chest pain or pressure.  She also has not experienced any lower extremity edema, orthopnea, or PND.  She had an echo many years ago that reported some degree of diastolic dysfunction but she has not had any repeat imaging lately.  She has been trying to work on her diet and eats mostly fish and chicken.  She tries not to fry her food and eats lots of salads.   Elizabeth Gamble's husband is my patient, Elizabeth Gamble  Past Medical History:  Diagnosis Date  . Arthritis   . Depression   . Esophageal reflux   . Essential hypertension 12/03/2017  . History of kidney stones   . Hypertension   . Hypothyroidism   . Hypothyroidism 12/03/2017  . Obesity (BMI 30-39.9) 12/03/2017  . Shortness of breath 12/03/2017    Past Surgical History:  Procedure Laterality Date  . FOOT SURGERY    . KNEE SURGERY Right   . TONSILLECTOMY    . TUBAL LIGATION       Current Outpatient Medications  Medication Sig Dispense Refill  . acetaminophen (TYLENOL) 500 MG tablet Take 500 mg by mouth at bedtime.    Marland Kitchen amLODipine (NORVASC) 5 MG tablet Take 5 mg by mouth daily.     Marland Kitchen aspirin EC 81 MG tablet Take 81 mg by mouth daily.     . Calcium Carb-Cholecalciferol (CALTRATE 600+D3) 600-800 MG-UNIT TABS Take 1 tablet by mouth daily.    . DULoxetine (CYMBALTA) 60 MG capsule Take 60 mg by mouth daily.     . iron polysaccharides (FERREX 150) 150 MG capsule Take 150 mg by mouth daily.    Marland Kitchen levothyroxine (SYNTHROID, LEVOTHROID) 50 MCG tablet Take 50 mcg by mouth daily before breakfast.     . loratadine (CLARITIN) 10 MG tablet Take 10 mg by mouth daily.    . meloxicam (MOBIC) 15 MG tablet Take 15 mg by mouth daily.     . Multiple Vitamin (MULTIVITAMIN) tablet Take 1 tablet by mouth daily.    . Multiple Vitamins-Minerals (MACULAR HEALTH FORMULA PO) Take 1 each by mouth daily.    . Omega-3 Fatty Acids (FISH OIL OMEGA-3 PO) Take 1 capsule by mouth daily.    Marland Kitchen telmisartan-hydrochlorothiazide (MICARDIS HCT) 80-25 MG tablet Take 1 tablet by mouth daily.     . vitamin C (ASCORBIC ACID) 500 MG tablet Take 500 mg by mouth daily.    . Vitamin D, Ergocalciferol, (DRISDOL) 50000 units CAPS capsule Take 50,000 Units by mouth every 7 (seven) days.     No current facility-administered medications for this visit.     Allergies:  Penicillins and Allegra [fexofenadine]    Social History:  The patient  reports that she quit smoking about 26 years ago. Her smoking use included cigarettes. She started smoking about 57 years ago. She smoked 0.25 packs per day. She has never used smokeless tobacco.   Family History:  The patient's family history includes Alzheimer's disease in her mother; High Cholesterol in her mother; Hypertension in her mother; Hypothyroidism in her other; Rheum arthritis in her sister; Supraventricular tachycardia in her sister.    ROS:  Please see the history of present illness.   Otherwise, review of systems are positive for none.   All other systems are reviewed and negative.    PHYSICAL EXAM: VS:  BP 118/76 (BP Location: Right Arm, Cuff Size: Large)   Pulse 80   Ht 5\' 3"  (1.6 m)   Wt 215 lb (97.5 kg)   SpO2 98%    BMI 38.09 kg/m  , BMI Body mass index is 38.09 kg/m. GENERAL:  Well appearing HEENT:  Pupils equal round and reactive, fundi not visualized, oral mucosa unremarkable NECK:  No jugular venous distention, waveform within normal limits, carotid upstroke brisk and symmetric, no bruits LUNGS:  Clear to auscultation bilaterally HEART:  RRR.  PMI not displaced or sustained,S1 and S2 within normal limits, no S3, no S4, no clicks, no rubs, no murmurs ABD:  Flat, positive bowel sounds normal in frequency in pitch, no bruits, no rebound, no guarding, no midline pulsatile mass, no hepatomegaly, no splenomegaly EXT:  2 plus pulses throughout, no edema, no cyanosis no clubbing SKIN:  No rashes no nodules NEURO:  Cranial nerves II through XII grossly intact, motor grossly intact throughout PSYCH:  Cognitively intact, oriented to person place and time   EKG:  EKG is ordered today. The ekg ordered today demonstrates sinus rhythm.  Rate 67 bpm.  Short PR interval.     Recent Labs: No results found for requested labs within last 8760 hours.   10/07/2017: Sodium 141, potassium 4.0, BUN 25, creatinine 0.9 AST 16, ALT 13 WBC 6.26, hemoglobin 11.8, hematocrit 38.5, platelets 228 Total cholesterol 164, triglycerides 108, HDL 39, LDL 103 TSH 3.41 Free T4 1.1   Lipid Panel No results found for: CHOL, TRIG, HDL, CHOLHDL, VLDL, LDLCALC, LDLDIRECT    Wt Readings from Last 3 Encounters:  12/03/17 215 lb (97.5 kg)      ASSESSMENT AND PLAN:  # Shortness of breath:  I suspect that her exertional dyspnea is due to physical inactivity and obesity.  However she does have risk factors.  She does not appear volume overloaded on exam and I do not hear any significant valvular abnormalities.  We will get an echocardiogram to assess for heart failure or pulmonary hypertension.  We will also get an ETT to evaluate for ischemia.  If these are normal I recommend that she [facility]  regular[facility].  # Hypertension:   BP well-controlled on amlodipine, telmisartan and HCTZ.   Current medicines are reviewed at length with the patient today.  The patient does not have concerns regarding medicines.  The following changes have been made:  no change  Labs/ tests ordered today include:   Orders Placed This Encounter  Procedures  . Exercise Tolerance Test  . ECHOCARDIOGRAM COMPLETE     Disposition:   FU with Shateria Paternostro C. Duke Salviaandolph, MD, [facility] in 2 months.      Signed, Tri Chittick C. Duke Salviaandolph, MD, [facility]  12/03/2017 12:21 PM  [facility]

## 2017-12-09 ENCOUNTER — Other Ambulatory Visit: Payer: Self-pay

## 2017-12-09 ENCOUNTER — Ambulatory Visit (HOSPITAL_COMMUNITY): Payer: Medicare HMO | Attending: Cardiovascular Disease

## 2017-12-09 DIAGNOSIS — R0600 Dyspnea, unspecified: Secondary | ICD-10-CM | POA: Insufficient documentation

## 2017-12-09 DIAGNOSIS — I371 Nonrheumatic pulmonary valve insufficiency: Secondary | ICD-10-CM | POA: Insufficient documentation

## 2017-12-09 DIAGNOSIS — I10 Essential (primary) hypertension: Secondary | ICD-10-CM | POA: Insufficient documentation

## 2017-12-09 DIAGNOSIS — E039 Hypothyroidism, unspecified: Secondary | ICD-10-CM | POA: Diagnosis not present

## 2017-12-09 DIAGNOSIS — Z6838 Body mass index (BMI) 38.0-38.9, adult: Secondary | ICD-10-CM | POA: Insufficient documentation

## 2017-12-09 DIAGNOSIS — I071 Rheumatic tricuspid insufficiency: Secondary | ICD-10-CM | POA: Insufficient documentation

## 2017-12-09 DIAGNOSIS — E669 Obesity, unspecified: Secondary | ICD-10-CM | POA: Diagnosis not present

## 2017-12-11 ENCOUNTER — Telehealth (HOSPITAL_COMMUNITY): Payer: Self-pay

## 2017-12-11 NOTE — Telephone Encounter (Signed)
Encounter complete. 

## 2017-12-16 ENCOUNTER — Ambulatory Visit (HOSPITAL_COMMUNITY)
Admission: RE | Admit: 2017-12-16 | Discharge: 2017-12-16 | Disposition: A | Discharge status: Home / Self Care | Payer: Medicare HMO | Source: Ambulatory Visit | Attending: Cardiology | Admitting: Cardiology

## 2017-12-16 DIAGNOSIS — R0600 Dyspnea, unspecified: Secondary | ICD-10-CM | POA: Diagnosis not present

## 2017-12-16 DIAGNOSIS — Z23 Encounter for immunization: Secondary | ICD-10-CM | POA: Diagnosis not present

## 2017-12-16 LAB — EXERCISE TOLERANCE TEST
CHL CUP RESTING HR STRESS: 86 {beats}/min
CSEPEDS: 7 s
CSEPEW: 4.6 METS
CSEPPHR: 155 {beats}/min
Exercise duration (min): 2 min
MPHR: 147 {beats}/min
Percent HR: 105 %
RPE: 19

## 2018-01-27 ENCOUNTER — Ambulatory Visit: Payer: Medicare HMO | Admitting: Cardiovascular Disease

## 2018-01-27 ENCOUNTER — Encounter: Payer: Self-pay | Admitting: Cardiovascular Disease

## 2018-01-27 VITALS — BP 112/62 | HR 85 | Ht 63.0 in | Wt 213.0 lb

## 2018-01-27 DIAGNOSIS — R0602 Shortness of breath: Secondary | ICD-10-CM | POA: Diagnosis not present

## 2018-01-27 DIAGNOSIS — I10 Essential (primary) hypertension: Secondary | ICD-10-CM | POA: Diagnosis not present

## 2018-01-27 NOTE — Patient Instructions (Signed)
Medication Instructions:  ?Your physician recommends that you continue on your current medications as directed. Please refer to the Current Medication list given to you today.  ? ?Labwork: ?NONE ? ?Testing/Procedures: ?NONE ? ?Follow-Up: ?AS NEEDED  ? ?  ?

## 2018-01-27 NOTE — Progress Notes (Signed)
Cardiology Office Note   Date:  01/27/2018   ID:  Elizabeth Gamble, DOB 1945-06-25, MRN [id number]  PCP:  Chilton Greathouse, MD  Cardiologist:   Chilton Si, MD   No chief complaint on file.    History of Present Illness: Elizabeth Gamble is a 72 y.o. female with hypertension, hyperlipidemia, obesity here for follow up.  She was initially seen 11/2017 for the evaluation of shortness of breath.  Ms. Koury saw Dr. Felipa Eth on 10/2017 and noted increasing exertional dyspnea.  She started to notice it several months ago as she started moving her mother into assisted living.  Prior to that she wasn't getting much exercise.  She now feels short of breath when she wakes up at night and has to walk from her bed to her bathroom.  She was referred for an echo 12/2017 that revealed LVEF 65 to 70% with aortic sclerosis but no stenosis and moderate tricuspid regurgitation.  PASP was 42 mmHg.  She also had an ETT 12/16/2017 that was negative for ischemia.  She exercised for 2 minutes and achieved 4.6 METS on a Bruce protocol.  Since that time she is she moved to a new and more steps.  She thinks this increased activity.  She has not had any lower extremity edema, orthopnea, or PND.  She also denies any chest pain or pressure.   Past Medical History:  Diagnosis Date  . Arthritis   . Depression   . Esophageal reflux   . Essential hypertension 12/03/2017  . History of kidney stones   . Hypertension   . Hypothyroidism   . Hypothyroidism 12/03/2017  . Obesity (BMI 30-39.9) 12/03/2017  . Shortness of breath 12/03/2017    Past Surgical History:  Procedure Laterality Date  . FOOT SURGERY    . KNEE SURGERY Right   . TONSILLECTOMY    . TUBAL LIGATION       Current Outpatient Medications  Medication Sig Dispense Refill  . acetaminophen (TYLENOL) 500 MG tablet Take 500 mg by mouth at bedtime.    Marland Kitchen amLODipine (NORVASC) 5 MG tablet Take 5 mg by mouth daily.     Marland Kitchen aspirin EC 81 MG tablet Take 81 mg by  mouth daily.    . Calcium Carb-Cholecalciferol (CALTRATE 600+D3) 600-800 MG-UNIT TABS Take 1 tablet by mouth daily.    . DULoxetine (CYMBALTA) 60 MG capsule Take 60 mg by mouth daily.     . iron polysaccharides (FERREX 150) 150 MG capsule Take 150 mg by mouth daily.    Marland Kitchen levothyroxine (SYNTHROID, LEVOTHROID) 50 MCG tablet Take 50 mcg by mouth daily before breakfast.     . loratadine (CLARITIN) 10 MG tablet Take 10 mg by mouth daily.    . meloxicam (MOBIC) 15 MG tablet Take 15 mg by mouth daily.     . Multiple Vitamin (MULTIVITAMIN) tablet Take 1 tablet by mouth daily.    . Multiple Vitamins-Minerals (MACULAR HEALTH FORMULA PO) Take 1 each by mouth daily.    . Omega-3 Fatty Acids (FISH OIL OMEGA-3 PO) Take 1 capsule by mouth daily.    Marland Kitchen telmisartan-hydrochlorothiazide (MICARDIS HCT) 80-25 MG tablet Take 1 tablet by mouth daily.     . vitamin C (ASCORBIC ACID) 500 MG tablet Take 500 mg by mouth daily.    . Vitamin D, Ergocalciferol, (DRISDOL) 50000 units CAPS capsule Take 50,000 Units by mouth every 7 (seven) days.     No current facility-administered medications for this visit.     Allergies:  Penicillins and Allegra [fexofenadine]    Social History:  The patient  reports that she quit smoking about 26 years ago. Her smoking use included cigarettes. She started smoking about 57 years ago. She smoked 0.25 packs per day. She has never used smokeless tobacco.   Family History:  The patient's family history includes Alzheimer's disease in her mother; High Cholesterol in her mother; Hypertension in her mother; Hypothyroidism in her other; Rheum arthritis in her sister; Supraventricular tachycardia in her sister.    ROS:  Please see the history of present illness.   Otherwise, review of systems are positive for none.   All other systems are reviewed and negative.    PHYSICAL EXAM: VS:  BP 112/62 (BP Location: Right Arm, Patient Position: Sitting, Cuff Size: Normal)   Pulse 85   Ht 5\' 3"  (1.6  m)   Wt 213 lb (96.6 kg)   SpO2 97%   BMI 37.73 kg/m  , BMI Body mass index is 37.73 kg/m. GENERAL:  Well appearing HEENT: Pupils equal round and reactive, fundi not visualized, oral mucosa unremarkable NECK:  No jugular venous distention, waveform within normal limits, carotid upstroke brisk and symmetric, no bruits LUNGS:  Clear to auscultation bilaterally HEART:  RRR.  PMI not displaced or sustained,S1 and S2 within normal limits, no S3, no S4, no clicks, no rubs, no murmurs ABD:  Flat, positive bowel sounds normal in frequency in pitch, no bruits, no rebound, no guarding, no midline pulsatile mass, no hepatomegaly, no splenomegaly EXT:  2 plus pulses throughout, no edema, no cyanosis no clubbing SKIN:  No rashes no nodules NEURO:  Cranial nerves II through XII grossly intact, motor grossly intact throughout PSYCH:  Cognitively intact, oriented to person place and time   EKG:  EKG is not ordered today. The ekg ordered 10/07/17 demonstrates sinus rhythm.  Rate 67 bpm.  Short PR interval.    Echo 12/09/17: Study Conclusions  - Left ventricle: The cavity size was normal. Wall thickness was   normal. Systolic function was vigorous. The estimated ejection   fraction was in the range of 65% to 70%. Left ventricular   diastolic function parameters were normal. - Aortic valve: Sclerosis without stenosis. There was trivial   regurgitation. - Left atrium: The atrium was mildly dilated. - Atrial septum: No defect or patent foramen ovale was identified. - Tricuspid valve: There was moderate regurgitation.  ETT 12/16/17:  Blood pressure demonstrated a normal response to exercise.  There was no ST segment deviation noted during stress.  Negative, adequate stress test.  Recent Labs: No results found for requested labs within last 8760 hours.   6/262019: Sodium 141, potassium 4.0, BUN 25, creatinine 0.9 AST 16, ALT 13 WBC 6.26, hemoglobin 11.8, hematocrit 38.5, platelets 228 Total  cholesterol 164, triglycerides 108, HDL 39, LDL 103 TSH 3.41 Free T4 1.1   Lipid Panel No results found for: CHOL, TRIG, HDL, CHOLHDL, VLDL, LDLCALC, LDLDIRECT    Wt Readings from Last 3 Encounters:  01/27/18 213 lb (96.6 kg)  12/03/17 215 lb (97.5 kg)      ASSESSMENT AND PLAN:  # Shortness of breath:  I suspect that her exertional dyspnea is due to physical inactivity and obesity.  ETT and echo were negative for ischemia and heart failure.  She is feeling better after walking the stairs more.  She was encouraged to increase her exercise to 150 minutes per week.   # Hypertension:   BP well-controlled on amlodipine, telmisartan and HCTZ.  Current medicines are reviewed at length with the patient today.  The patient does not have concerns regarding medicines.  The following changes have been made:  no change  Labs/ tests ordered today include:   No orders of the defined types were placed in this encounter.    Disposition:   FU with Notnamed Scholz C. Duke Salvia, MD, [facility] as needed.     Signed, Muntaha Vermette C. Duke Salvia, MD, [facility]  01/27/2018 12:45 PM    [facility]

## 2018-02-01 DIAGNOSIS — Z1231 Encounter for screening mammogram for malignant neoplasm of breast: Secondary | ICD-10-CM | POA: Diagnosis not present

## 2018-02-01 DIAGNOSIS — Z8262 Family history of osteoporosis: Secondary | ICD-10-CM | POA: Diagnosis not present

## 2018-02-01 DIAGNOSIS — M8588 Other specified disorders of bone density and structure, other site: Secondary | ICD-10-CM | POA: Diagnosis not present

## 2021-07-04 ENCOUNTER — Other Ambulatory Visit: Payer: Self-pay | Admitting: Obstetrics

## 2021-07-30 ENCOUNTER — Ambulatory Visit (HOSPITAL_COMMUNITY)
Admission: RE | Admit: 2021-07-30 | Discharge: 2021-07-30 | Disposition: A | Discharge status: Home / Self Care | Payer: Medicare Other | Source: Ambulatory Visit | Attending: Obstetrics | Admitting: Obstetrics

## 2021-07-30 DIAGNOSIS — M810 Age-related osteoporosis without current pathological fracture: Secondary | ICD-10-CM | POA: Insufficient documentation

## 2021-07-30 MED ORDER — ZOLEDRONIC ACID 5 MG/100ML IV SOLN
5.0000 mg | Freq: Once | INTRAVENOUS | Status: AC
  Administered 2021-07-30: 5 mg via INTRAVENOUS

## 2021-07-30 MED ORDER — ZOLEDRONIC ACID 5 MG/100ML IV SOLN
INTRAVENOUS | Status: AC
  Filled 2021-07-30: qty 100

## 2022-11-25 ENCOUNTER — Other Ambulatory Visit: Payer: Self-pay | Admitting: Internal Medicine

## 2022-11-25 DIAGNOSIS — R197 Diarrhea, unspecified: Secondary | ICD-10-CM

## 2022-11-25 DIAGNOSIS — R109 Unspecified abdominal pain: Secondary | ICD-10-CM

## 2022-11-26 ENCOUNTER — Ambulatory Visit
Admission: RE | Admit: 2022-11-26 | Discharge: 2022-11-26 | Disposition: A | Discharge status: Home / Self Care | Payer: Medicare Other | Source: Ambulatory Visit | Attending: Internal Medicine | Admitting: Internal Medicine

## 2022-11-26 DIAGNOSIS — R109 Unspecified abdominal pain: Secondary | ICD-10-CM

## 2022-11-26 DIAGNOSIS — R197 Diarrhea, unspecified: Secondary | ICD-10-CM

## 2022-11-26 MED ORDER — IOPAMIDOL (ISOVUE-300) INJECTION 61%
100.0000 mL | Freq: Once | INTRAVENOUS | Status: AC | PRN
  Administered 2022-11-26: 80 mL via INTRAVENOUS

## 2023-01-21 ENCOUNTER — Other Ambulatory Visit (HOSPITAL_COMMUNITY): Payer: Self-pay | Admitting: *Deleted

## 2023-01-22 ENCOUNTER — Ambulatory Visit (HOSPITAL_COMMUNITY)
Admission: RE | Admit: 2023-01-22 | Discharge: 2023-01-22 | Disposition: A | Discharge status: Home / Self Care | Payer: Medicare Other | Source: Ambulatory Visit | Attending: Obstetrics | Admitting: Obstetrics

## 2023-01-22 DIAGNOSIS — M810 Age-related osteoporosis without current pathological fracture: Secondary | ICD-10-CM | POA: Diagnosis present

## 2023-01-22 MED ORDER — ZOLEDRONIC ACID 5 MG/100ML IV SOLN
INTRAVENOUS | Status: AC
  Administered 2023-01-22: 5 mg
  Filled 2023-01-22: qty 100

## 2024-02-02 ENCOUNTER — Other Ambulatory Visit: Payer: Self-pay | Admitting: Obstetrics

## 2024-02-02 ENCOUNTER — Other Ambulatory Visit (HOSPITAL_COMMUNITY): Payer: Self-pay | Admitting: Pharmacy Technician

## 2024-02-02 ENCOUNTER — Telehealth (HOSPITAL_COMMUNITY): Payer: Self-pay | Admitting: Pharmacy Technician

## 2024-02-02 DIAGNOSIS — M810 Age-related osteoporosis without current pathological fracture: Secondary | ICD-10-CM | POA: Insufficient documentation

## 2024-02-02 NOTE — Telephone Encounter (Signed)
 Auth Submission: APPROVED Site of care: [facility] Payer: [employer] MEDICARE Medication & CPT/J Code(s) submitted: Reclast  (Zolendronic acid) J3489 Diagnosis Code: M81.0 Route of submission (phone, fax, portal): portal Phone # Fax # Auth type: Buy/Bill HB Units/visits requested: 5mg  x 1 dose, q 12 months  Reference number: [id number] Approval from: 02/02/2024 to 02/01/25      Dagoberto Armour, CPhT [facility] Phone: [phone] 02/02/2024

## 2024-02-26 ENCOUNTER — Other Ambulatory Visit: Payer: Self-pay | Admitting: Obstetrics

## 2024-03-10 ENCOUNTER — Ambulatory Visit (HOSPITAL_COMMUNITY)
Admission: RE | Admit: 2024-03-10 | Discharge: 2024-03-10 | Disposition: A | Source: Ambulatory Visit | Attending: Obstetrics

## 2024-03-10 VITALS — BP 114/90 | HR 81 | Temp 98.4°F | Resp 16

## 2024-03-10 DIAGNOSIS — M810 Age-related osteoporosis without current pathological fracture: Secondary | ICD-10-CM | POA: Insufficient documentation

## 2024-03-10 MED ORDER — DIPHENHYDRAMINE HCL 25 MG PO CAPS: 25.0000 mg | ORAL_CAPSULE | Freq: Once | ORAL | Status: DC

## 2024-03-10 MED ORDER — ZOLEDRONIC ACID 5 MG/100ML IV SOLN
5.0000 mg | Freq: Once | INTRAVENOUS | Status: AC
  Administered 2024-03-10: 5 mg via INTRAVENOUS

## 2024-03-10 MED ORDER — ZOLEDRONIC ACID 5 MG/100ML IV SOLN
INTRAVENOUS | Status: AC
  Filled 2024-03-10: qty 100

## 2024-03-10 MED ORDER — SODIUM CHLORIDE 0.9 % IV SOLN: INTRAVENOUS | Status: DC

## 2024-03-10 MED ORDER — ACETAMINOPHEN 325 MG PO TABS: 650.0000 mg | ORAL_TABLET | Freq: Once | ORAL | Status: DC
